# Patient Record
Sex: Male | Born: 1965 | Race: White | Hispanic: No | Marital: Married | Smoking: Never smoker
Health system: Southern US, Community
[De-identification: ages and names within clinical notes are randomized; demographics above are authoritative.]

---

## 2016-10-25 DIAGNOSIS — I10 Essential (primary) hypertension: Secondary | ICD-10-CM | POA: Diagnosis not present

## 2016-10-25 DIAGNOSIS — J40 Bronchitis, not specified as acute or chronic: Secondary | ICD-10-CM | POA: Diagnosis not present

## 2016-10-25 DIAGNOSIS — J329 Chronic sinusitis, unspecified: Secondary | ICD-10-CM | POA: Diagnosis not present

## 2017-10-03 DIAGNOSIS — I10 Essential (primary) hypertension: Secondary | ICD-10-CM | POA: Diagnosis not present

## 2017-12-28 DIAGNOSIS — M25561 Pain in right knee: Secondary | ICD-10-CM | POA: Diagnosis not present

## 2018-03-23 ENCOUNTER — Telehealth (INDEPENDENT_AMBULATORY_CARE_PROVIDER_SITE_OTHER): Payer: Self-pay | Admitting: Orthopedic Surgery

## 2018-03-23 ENCOUNTER — Ambulatory Visit (INDEPENDENT_AMBULATORY_CARE_PROVIDER_SITE_OTHER): Payer: 59 | Admitting: Orthopedic Surgery

## 2018-03-23 ENCOUNTER — Encounter (INDEPENDENT_AMBULATORY_CARE_PROVIDER_SITE_OTHER): Payer: Self-pay | Admitting: Orthopedic Surgery

## 2018-03-23 DIAGNOSIS — M25561 Pain in right knee: Secondary | ICD-10-CM | POA: Diagnosis not present

## 2018-03-23 DIAGNOSIS — S838X1A Sprain of other specified parts of right knee, initial encounter: Secondary | ICD-10-CM

## 2018-03-23 NOTE — Progress Notes (Signed)
Mri

## 2018-03-23 NOTE — Telephone Encounter (Signed)
Returned call to Darla from [facility] concerning referral   Left message on voicemail. patient was seen today and have a return appointment 04/10/18   [phone] Ext: [phone]

## 2018-03-24 ENCOUNTER — Encounter (INDEPENDENT_AMBULATORY_CARE_PROVIDER_SITE_OTHER): Payer: Self-pay | Admitting: Orthopedic Surgery

## 2018-03-24 NOTE — Progress Notes (Signed)
   Office Visit Note   Patient: Micheal Hoover           Date of Birth: 05/08/1966           MRN: [id number] Visit Date: 03/23/2018 Requested by: Lise Auer, MD [address] [place], [place] [address] PCP: Lise Auer, MD  Subjective: Chief Complaint  Patient presents with  . Right Knee - Pain    HPI: Patient presents for evaluation of right knee.  Patient's been having pain since June.  He was walking in his yard in June and he felt a knifelike pain around the medial aspect of the knee.  Had on and off symptoms since that time with knee swelling.  He has had radiographs which are intact by his report.  He works for [employer] in [employer].  If he is on the knee a lot it begins to hurt him significantly.  He takes no medications except for occasional Tylenol.  His symptoms have not improved after at least 8 weeks of conservative treatment.              ROS: All systems reviewed are negative as they relate to the chief complaint within the history of present illness.  Patient denies  fevers or chills.   Assessment & Plan: Visit Diagnoses:  1. Injury of meniscus of right knee, initial encounter   2. Acute pain of right knee     Plan: Impression is right knee medial sided pain with effusion and knifelike symptoms consistent with likely medial meniscal tear.  Collateral and cruciate ligaments are stable.  I agree with Dr. Roby Lofts assessment that likely meniscal pathology.  MRI scan pending.  Follow-up after that study  Follow-Up Instructions: Return for after MRI.   Orders:  Orders Placed This Encounter  Procedures  . MR Knee Right w/o contrast   No orders of the defined types were placed in this encounter.     Procedures: No procedures performed   Clinical Data: No additional findings.  Objective: Vital Signs: There were no vitals taken for this visit.  Physical Exam:   Constitutional: Patient appears well-developed HEENT:  Head: Normocephalic Eyes:EOM  are normal Neck: Normal range of motion Cardiovascular: Normal rate Pulmonary/chest: Effort normal Neurologic: Patient is alert Skin: Skin is warm Psychiatric: Patient has normal mood and affect    Ortho Exam: Ortho exam demonstrates excellent quad and hamstring tone in both legs.  Mild effusion is present on the right but no effusion on the left.  Collateral and cruciate ligaments feel stable.  Range of motion is excellent.  Medial joint line tenderness is present on the right.  Negative patellar apprehension.  No tenderness of the patellar quad tendon.  Specialty Comments:  No specialty comments available.  Imaging: No results found.   PMFS History: There are no active problems to display for this patient.  History reviewed. No pertinent past medical history.  History reviewed. No pertinent family history.  History reviewed. No pertinent surgical history. Social History   Occupational History  . Not on file  Tobacco Use  . Smoking status: Not on file  Substance and Sexual Activity  . Alcohol use: Not on file  . Drug use: Not on file  . Sexual activity: Not on file

## 2018-04-10 ENCOUNTER — Ambulatory Visit (INDEPENDENT_AMBULATORY_CARE_PROVIDER_SITE_OTHER): Payer: 59 | Admitting: Orthopedic Surgery

## 2018-04-11 ENCOUNTER — Ambulatory Visit
Admission: RE | Admit: 2018-04-11 | Discharge: 2018-04-11 | Disposition: A | Discharge status: Home / Self Care | Payer: 59 | Source: Ambulatory Visit | Attending: Orthopedic Surgery | Admitting: Orthopedic Surgery

## 2018-04-11 DIAGNOSIS — M23221 Derangement of posterior horn of medial meniscus due to old tear or injury, right knee: Secondary | ICD-10-CM | POA: Diagnosis not present

## 2018-04-11 DIAGNOSIS — M25561 Pain in right knee: Secondary | ICD-10-CM

## 2018-04-19 ENCOUNTER — Encounter (INDEPENDENT_AMBULATORY_CARE_PROVIDER_SITE_OTHER): Payer: Self-pay | Admitting: Orthopedic Surgery

## 2018-04-19 ENCOUNTER — Ambulatory Visit (INDEPENDENT_AMBULATORY_CARE_PROVIDER_SITE_OTHER): Payer: 59 | Admitting: Orthopedic Surgery

## 2018-04-19 DIAGNOSIS — S838X1A Sprain of other specified parts of right knee, initial encounter: Secondary | ICD-10-CM | POA: Diagnosis not present

## 2018-04-24 ENCOUNTER — Encounter (INDEPENDENT_AMBULATORY_CARE_PROVIDER_SITE_OTHER): Payer: Self-pay | Admitting: Orthopedic Surgery

## 2018-04-24 NOTE — Progress Notes (Signed)
   Office Visit Note   Patient: Micheal Hoover           Date of Birth: [birth year]           MRN: [id number] Visit Date: 04/19/2018 Requested by: Lise Auer, MD [address] [place], [place] [address] PCP: Lise Auer, MD  Subjective: Chief Complaint  Patient presents with  . Right Knee - Follow-up    HPI: [name] is a patient with right knee pain.  Since I seen him he had an MRI scan.  That scan shows significant tearing of that medial meniscus which is complex and degenerative.  He states that he is somewhat better but his pain is not excruciating.  He works in Production designer, theatre/television/film.  He is had symptoms for 5 months.  Does report mechanical symptoms also.              ROS: All systems reviewed are negative as they relate to the chief complaint within the history of present illness.  Patient denies  fevers or chills.   Assessment & Plan: Visit Diagnoses:  1. Injury of meniscus of right knee, initial encounter     Plan: Impression is right knee medial meniscal tear symptomatic with persistent effusion in the joint with failure of conservative treatment.  Plan is right knee arthroscopy and debridement.  Risks and benefits are discussed including but limited to incomplete pain relief as well as development of arthritis in the stiffness.  Patient understands risk benefits and wishes to proceed.  All questions answered  Follow-Up Instructions: No follow-ups on file.   Orders:  No orders of the defined types were placed in this encounter.  No orders of the defined types were placed in this encounter.     Procedures: No procedures performed   Clinical Data: No additional findings.  Objective: Vital Signs: There were no vitals taken for this visit.  Physical Exam:   Constitutional: Patient appears well-developed HEENT:  Head: Normocephalic Eyes:EOM are normal Neck: Normal range of motion Cardiovascular: Normal rate Pulmonary/chest: Effort normal Neurologic: Patient is  alert Skin: Skin is warm Psychiatric: Patient has normal mood and affect    Ortho Exam: Ortho exam demonstrates mild effusion right knee with medial joint line tenderness.  Collateral crucial ligaments are stable.  Extensor mechanism is intact.  No other masses lymph adenopathy or skin changes noted in that right knee region range of motion is full.  Specialty Comments:  No specialty comments available.  Imaging: No results found.   PMFS History: There are no active problems to display for this patient.  History reviewed. No pertinent past medical history.  History reviewed. No pertinent family history.  History reviewed. No pertinent surgical history. Social History   Occupational History  . Not on file  Tobacco Use  . Smoking status: Never Smoker  . Smokeless tobacco: Never Used  Substance and Sexual Activity  . Alcohol use: Not on file  . Drug use: Not on file  . Sexual activity: Not on file

## 2018-05-08 ENCOUNTER — Telehealth (INDEPENDENT_AMBULATORY_CARE_PROVIDER_SITE_OTHER): Payer: Self-pay | Admitting: Orthopedic Surgery

## 2018-05-08 NOTE — Telephone Encounter (Signed)
y

## 2018-05-08 NOTE — Telephone Encounter (Signed)
Patient's wife called wanting to know if her husband can take his high blood pressure medication the morning of his surgery.  CB#[phone].  Thank you.

## 2018-05-08 NOTE — Telephone Encounter (Signed)
Please advise. Thanks.  

## 2018-05-08 NOTE — Telephone Encounter (Signed)
IC advised.  

## 2018-05-22 DIAGNOSIS — G8918 Other acute postprocedural pain: Secondary | ICD-10-CM | POA: Diagnosis not present

## 2018-05-22 DIAGNOSIS — M23351 Other meniscus derangements, posterior horn of lateral meniscus, right knee: Secondary | ICD-10-CM | POA: Diagnosis not present

## 2018-05-22 DIAGNOSIS — M23321 Other meniscus derangements, posterior horn of medial meniscus, right knee: Secondary | ICD-10-CM | POA: Diagnosis not present

## 2018-05-22 DIAGNOSIS — M94261 Chondromalacia, right knee: Secondary | ICD-10-CM | POA: Diagnosis not present

## 2018-05-29 ENCOUNTER — Ambulatory Visit (INDEPENDENT_AMBULATORY_CARE_PROVIDER_SITE_OTHER): Payer: 59 | Admitting: Orthopedic Surgery

## 2018-05-29 ENCOUNTER — Encounter (INDEPENDENT_AMBULATORY_CARE_PROVIDER_SITE_OTHER): Payer: Self-pay | Admitting: Orthopedic Surgery

## 2018-05-29 DIAGNOSIS — S838X1A Sprain of other specified parts of right knee, initial encounter: Secondary | ICD-10-CM

## 2018-05-29 NOTE — Progress Notes (Signed)
   Post-Op Visit Note   Patient: Micheal Hoover           Date of Birth: Aug 12, 1965           MRN: [id number] Visit Date: 05/29/2018 PCP: Lise Auer, MD   Assessment & Plan:  Chief Complaint:  Chief Complaint  Patient presents with  . Right Knee - Routine Post Op   Visit Diagnoses:  1. Injury of meniscus of right knee, initial encounter     Plan: Micheal Hoover is a patient is now about 8 days out right knee arthroscopy.  He has been doing reasonably well.  On exam he does have a moderate to large effusion which is aspirated today.  We got about 75 cc out.  He does have flexion past 90.  Continue to work on knee range of motion and strengthening exercises with a stationary bike.  He will be out of work at least until I see him back in about a month.  Follow-Up Instructions: Return in about 4 weeks (around 06/26/2018).   Orders:  No orders of the defined types were placed in this encounter.  No orders of the defined types were placed in this encounter.   Imaging: No results found.  PMFS History: There are no active problems to display for this patient.  History reviewed. No pertinent past medical history.  History reviewed. No pertinent family history.  History reviewed. No pertinent surgical history. Social History   Occupational History  . Not on file  Tobacco Use  . Smoking status: Never Smoker  . Smokeless tobacco: Never Used  Substance and Sexual Activity  . Alcohol use: Not on file  . Drug use: Not on file  . Sexual activity: Not on file

## 2018-06-26 ENCOUNTER — Encounter (INDEPENDENT_AMBULATORY_CARE_PROVIDER_SITE_OTHER): Payer: Self-pay | Admitting: Orthopedic Surgery

## 2018-06-26 ENCOUNTER — Ambulatory Visit (INDEPENDENT_AMBULATORY_CARE_PROVIDER_SITE_OTHER): Payer: 59 | Admitting: Orthopedic Surgery

## 2018-06-26 VITALS — Ht 68.0 in | Wt 240.0 lb

## 2018-06-26 DIAGNOSIS — S838X1A Sprain of other specified parts of right knee, initial encounter: Secondary | ICD-10-CM

## 2018-06-28 ENCOUNTER — Encounter (INDEPENDENT_AMBULATORY_CARE_PROVIDER_SITE_OTHER): Payer: Self-pay | Admitting: Orthopedic Surgery

## 2018-06-28 NOTE — Progress Notes (Signed)
   Post-Op Visit Note   Patient: Micheal Hoover           Date of Birth: 07-Dec-1965           MRN: [id number] Visit Date: 06/26/2018 PCP: Lise AuerKhan, Jaber A, MD   Assessment & Plan:  Chief Complaint:  Chief Complaint  Patient presents with  . Right Knee - Follow-up    05/22/18 Right Knee Arthroscopy   Visit Diagnoses:  1. Injury of meniscus of right knee, initial encounter     Plan: Micheal SorrowJerry is a patient is 4 weeks out right knee arthroscopy.  He is doing better.  He walked in a parade.  Hard for him actually be on his knees.  Ladders are okay.  On exam he has mild effusion excellent range of motion quad strength.  Gait is normal.  Plan at this time is for him to return to work but I do not think he can do any kneeling on his knee probably for about 4 to 6 weeks.  I will see him back as needed.  He does state that his preoperative pain and mechanical symptoms have resolved.  Follow-Up Instructions: Return if symptoms worsen or fail to improve.   Orders:  No orders of the defined types were placed in this encounter.  No orders of the defined types were placed in this encounter.   Imaging: No results found.  PMFS History: There are no active problems to display for this patient.  History reviewed. No pertinent past medical history.  History reviewed. No pertinent family history.  History reviewed. No pertinent surgical history. Social History   Occupational History  . Not on file  Tobacco Use  . Smoking status: Never Smoker  . Smokeless tobacco: Never Used  Substance and Sexual Activity  . Alcohol use: Not on file  . Drug use: Not on file  . Sexual activity: Not on file

## 2018-07-03 ENCOUNTER — Telehealth (INDEPENDENT_AMBULATORY_CARE_PROVIDER_SITE_OTHER): Payer: Self-pay | Admitting: Orthopedic Surgery

## 2018-07-03 NOTE — Telephone Encounter (Signed)
Ok to do

## 2018-07-03 NOTE — Telephone Encounter (Signed)
Pt needs a note saying he is okay to go to work without any restrictions.

## 2018-07-03 NOTE — Telephone Encounter (Signed)
y

## 2018-07-04 NOTE — Telephone Encounter (Signed)
IC patient discussed.  He will bring another paper as well that he needs signed. He will come in this AM and ask for me.

## 2018-08-22 DIAGNOSIS — Z6837 Body mass index (BMI) 37.0-37.9, adult: Secondary | ICD-10-CM | POA: Diagnosis not present

## 2018-08-22 DIAGNOSIS — J40 Bronchitis, not specified as acute or chronic: Secondary | ICD-10-CM | POA: Diagnosis not present

## 2018-08-22 DIAGNOSIS — J329 Chronic sinusitis, unspecified: Secondary | ICD-10-CM | POA: Diagnosis not present

## 2018-11-07 DIAGNOSIS — I10 Essential (primary) hypertension: Secondary | ICD-10-CM | POA: Diagnosis not present

## 2019-09-30 DIAGNOSIS — U071 COVID-19: Secondary | ICD-10-CM

## 2019-09-30 DIAGNOSIS — J1282 Pneumonia due to coronavirus disease 2019: Secondary | ICD-10-CM | POA: Diagnosis not present

## 2019-09-30 DIAGNOSIS — R0902 Hypoxemia: Secondary | ICD-10-CM

## 2019-09-30 DIAGNOSIS — I10 Essential (primary) hypertension: Secondary | ICD-10-CM

## 2019-10-01 DIAGNOSIS — R0902 Hypoxemia: Secondary | ICD-10-CM | POA: Diagnosis not present

## 2019-10-01 DIAGNOSIS — U071 COVID-19: Secondary | ICD-10-CM | POA: Diagnosis not present

## 2019-10-01 DIAGNOSIS — I10 Essential (primary) hypertension: Secondary | ICD-10-CM | POA: Diagnosis not present

## 2019-10-02 DIAGNOSIS — U071 COVID-19: Secondary | ICD-10-CM | POA: Diagnosis not present

## 2019-10-02 DIAGNOSIS — I10 Essential (primary) hypertension: Secondary | ICD-10-CM | POA: Diagnosis not present

## 2019-10-02 DIAGNOSIS — R0902 Hypoxemia: Secondary | ICD-10-CM | POA: Diagnosis not present

## 2019-10-03 DIAGNOSIS — I10 Essential (primary) hypertension: Secondary | ICD-10-CM | POA: Diagnosis not present

## 2019-10-03 DIAGNOSIS — R0902 Hypoxemia: Secondary | ICD-10-CM | POA: Diagnosis not present

## 2019-10-03 DIAGNOSIS — U071 COVID-19: Secondary | ICD-10-CM | POA: Diagnosis not present

## 2019-10-04 DIAGNOSIS — U071 COVID-19: Secondary | ICD-10-CM | POA: Diagnosis not present

## 2019-10-04 DIAGNOSIS — I10 Essential (primary) hypertension: Secondary | ICD-10-CM | POA: Diagnosis not present

## 2019-10-04 DIAGNOSIS — R0902 Hypoxemia: Secondary | ICD-10-CM | POA: Diagnosis not present

## 2019-10-05 DIAGNOSIS — R0902 Hypoxemia: Secondary | ICD-10-CM | POA: Diagnosis not present

## 2019-10-05 DIAGNOSIS — I10 Essential (primary) hypertension: Secondary | ICD-10-CM | POA: Diagnosis not present

## 2019-10-05 DIAGNOSIS — U071 COVID-19: Secondary | ICD-10-CM | POA: Diagnosis not present

## 2019-10-06 DIAGNOSIS — I10 Essential (primary) hypertension: Secondary | ICD-10-CM | POA: Diagnosis not present

## 2019-10-06 DIAGNOSIS — U071 COVID-19: Secondary | ICD-10-CM | POA: Diagnosis not present

## 2019-10-06 DIAGNOSIS — R0902 Hypoxemia: Secondary | ICD-10-CM | POA: Diagnosis not present

## 2020-07-11 IMAGING — MR MR KNEE*R* W/O CM
7 series · 38 of 40 positions shown · non-contrast
Comparison: None

CLINICAL DATA: Right medial and posterior knee pain.

EXAM:
MRI OF THE RIGHT KNEE WITHOUT CONTRAST
TECHNIQUE: Multiplanar, multisequence MR imaging of the knee was performed. No
intravenous contrast was administered.

[Series 7: T2 fat-sat · coronal · right · 4.0mm · 0.39mm/px · 5 of 28 slices shown (1 of 3)]
[im 1/28]
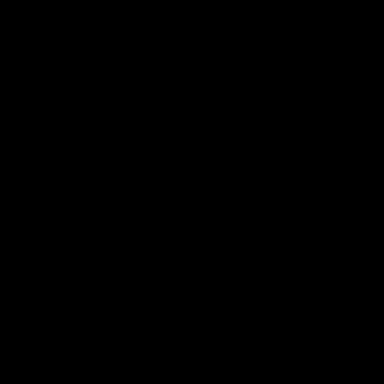
[im 7/28]
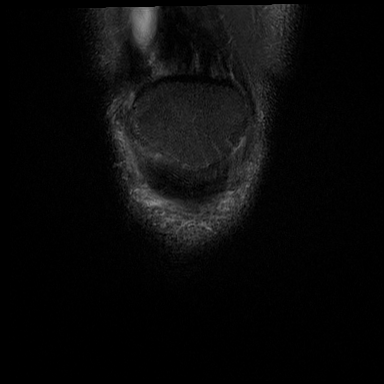
[im 14/28]
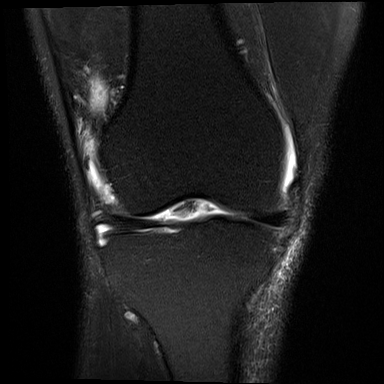
[im 21/28]
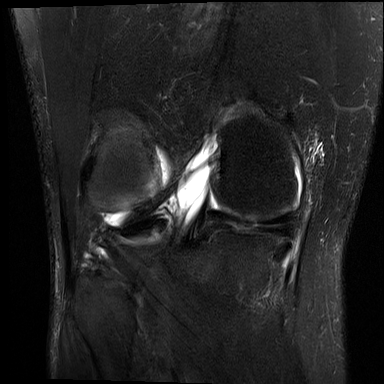
[im 28/28]
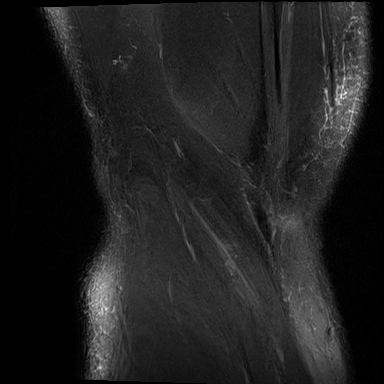

[Series 8: T1 · coronal · right · 4.0mm · 0.39mm/px · 4 of 28 slices shown]
[im 1/28]
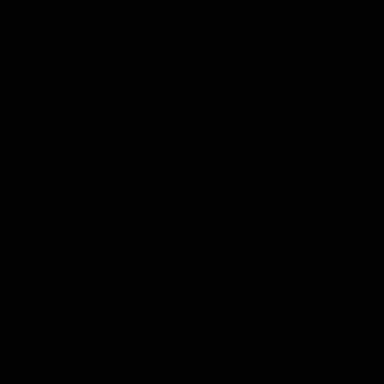
[im 6/28]
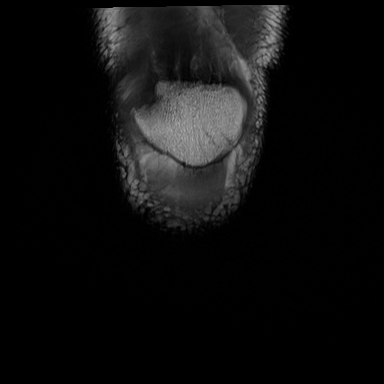
[im 11/28]
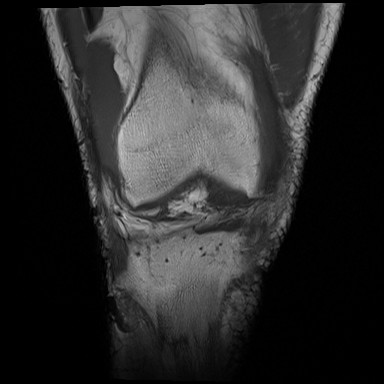
[im 17/28]
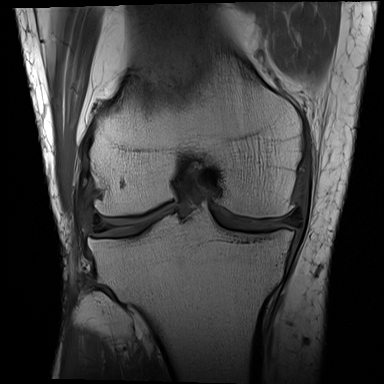

[Series 9: PD fat-sat · coronal · right · 3.0mm · 0.47mm/px · 6 of 28 slices shown (1 of 2)]
[im 1/28]
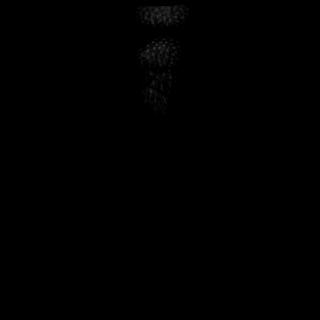
[im 6/28]
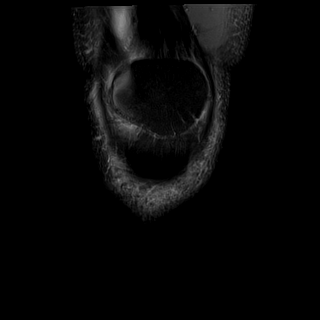
[im 11/28]
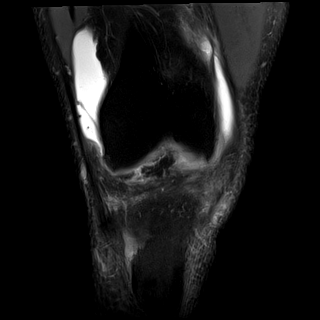
[im 17/28]
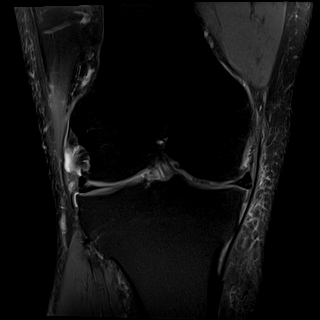
[im 22/28]
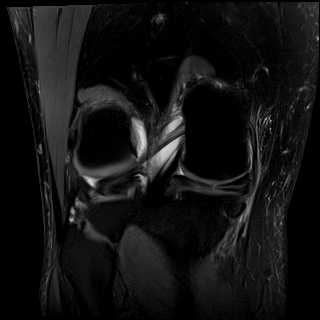
[im 28/28]
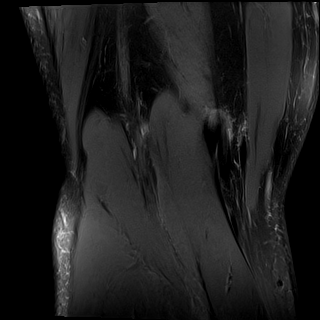

[Series 10: PD fat-sat · sagittal · right · 3.0mm · 0.39mm/px · 6 of 27 slices shown (2 of 2)]
[im 1/27]
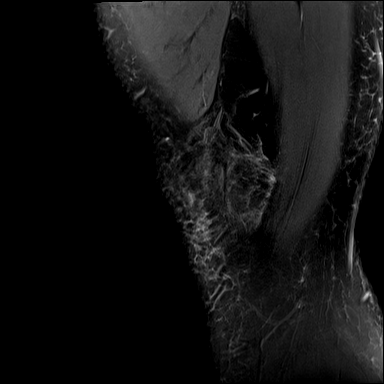
[im 6/27]
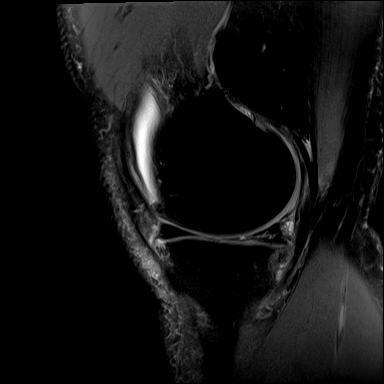
[im 11/27]
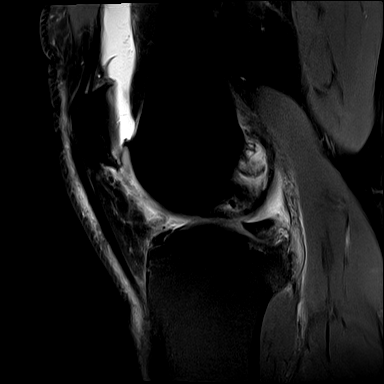
[im 16/27]
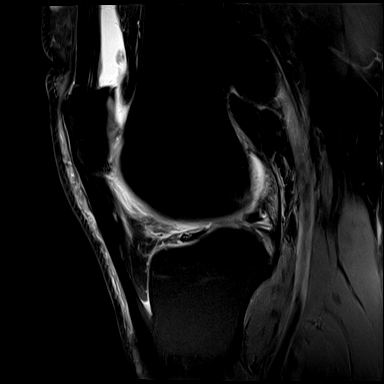
[im 21/27]
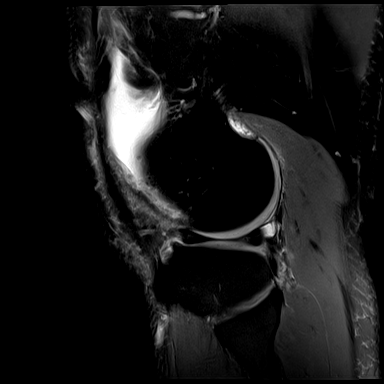
[im 27/27]
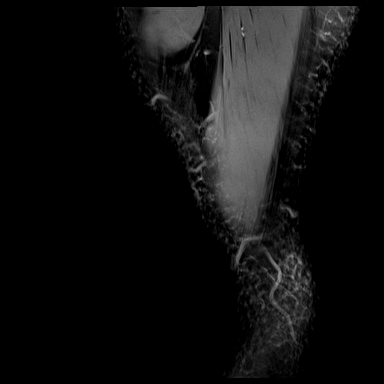

[Series 11: T2 fat-sat · sagittal · right · 3.0mm · 0.39mm/px · 6 of 27 slices shown (2 of 3)]
[im 1/27]
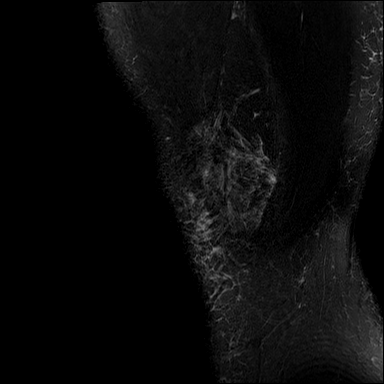
[im 6/27]
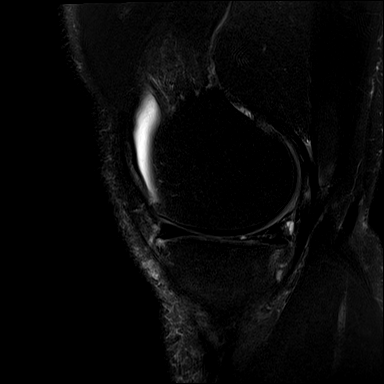
[im 11/27]
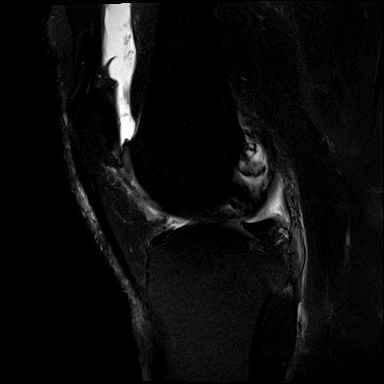
[im 16/27]
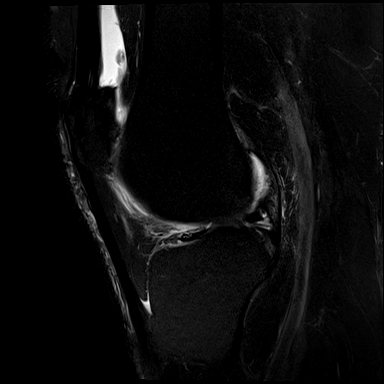
[im 21/27]
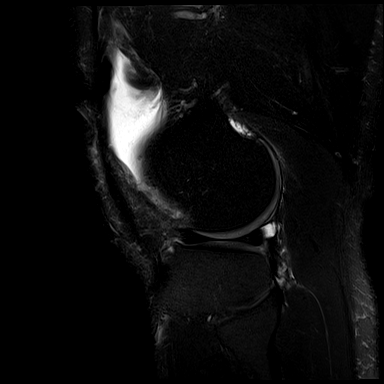
[im 27/27]
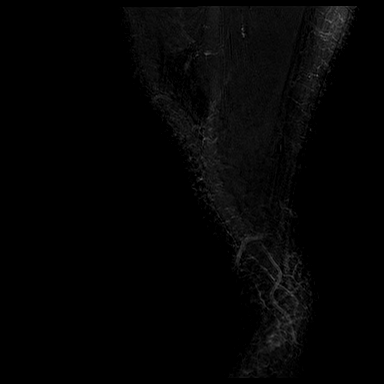

[Series 12: PD · oblique · right · 1.5mm · 0.44mm/px · 4 of 21 slices shown]
[im 1/21]
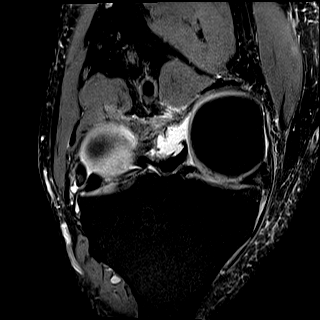
[im 7/21]
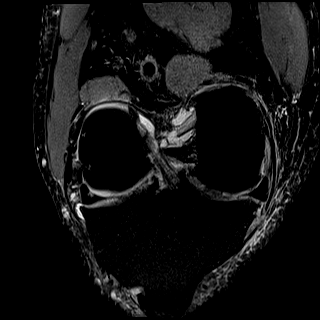
[im 14/21]
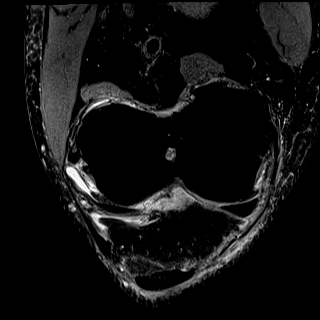
[im 21/21]
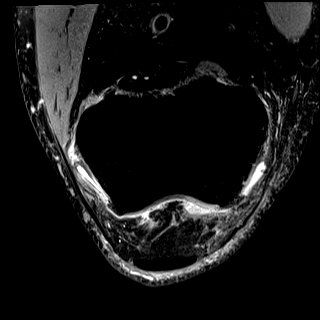

[Series 13: T2 fat-sat · axial · right · 4.0mm · 0.50mm/px · z∈[-81,+72]mm · 7 of 36 slices shown (3 of 3)]
[im 1/36]
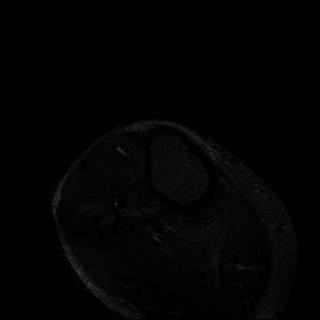
[im 6/36]
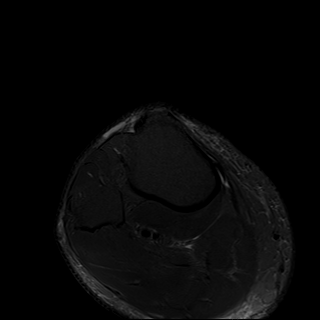
[im 12/36]
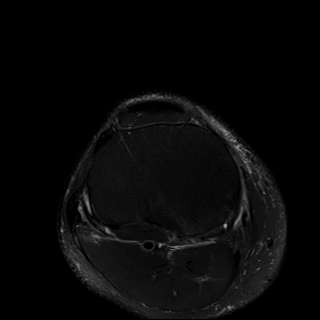
[im 18/36]
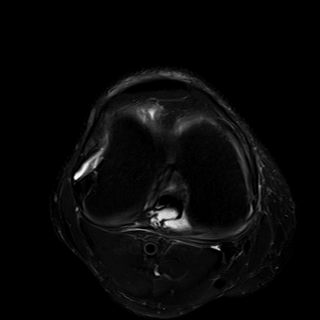
[im 24/36]
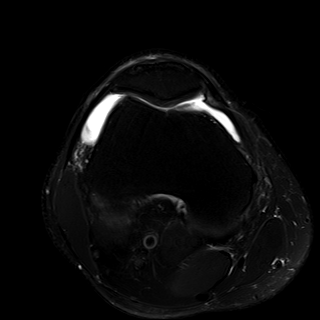
[im 30/36]
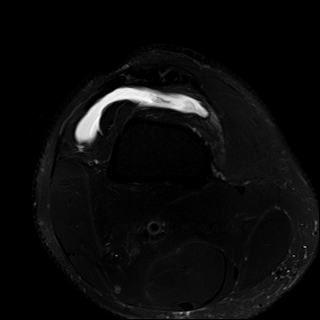
[im 36/36]
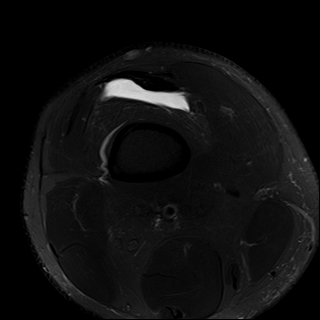

[38 of 40 positions shown; findings below may reference images not displayed]

FINDINGS: MENISCI

Medial meniscus: Severe complex tear of the posterior horn of the
medial meniscus with a radial component with the tear extending into
the posterior body of the medial meniscus.

Lateral meniscus: Radial tear of the posterior horn of the lateral
meniscus at the root.

LIGAMENTS

Cruciates:  Intact ACL and PCL.

Collaterals: Medial collateral ligament is intact. Lateral
collateral ligament complex is intact.

CARTILAGE

Patellofemoral: 10 mm chondral defect of the trochlear groove. Mild
partial-thickness cartilage loss the lateral patellofemoral
compartment.

Medial: Partial-thickness cartilage loss of the medial femorotibial
compartment.

Lateral: 5 mm high-grade partial-thickness cartilage loss of lateral
femoral condyle with a small area of delamination adjacently.

Joint: Large joint effusion. Normal Hoffa's fat. No plical
thickening.

Popliteal Fossa:  Tiny Baker's cyst.  Intact popliteus tendon.

Extensor Mechanism: Intact quadriceps tendon. Intact patellar
tendon. Intact medial patellar retinaculum. Intact lateral patellar
retinaculum. Intact MPFL.

Bones:  No acute osseous abnormality.  No aggressive osseous lesion.

Other: No fluid collection or hematoma.  Muscles are normal.
IMPRESSION: 1. Severe complex tear of the posterior horn of the medial meniscus
with a radial component with the tear extending into the posterior
body of the medial meniscus.
2. Radial tear of the posterior horn of the lateral meniscus at the
root.
3. Tricompartmental cartilage abnormalities as described above.
